# Patient Record
Sex: Male | Born: 1995 | Race: White | Hispanic: No | Marital: Single | Smoking: Never smoker
Health system: Southern US, Community
[De-identification: ages and names within clinical notes are randomized; demographics above are authoritative.]

## PROBLEM LIST (undated history)

## (undated) DIAGNOSIS — Z8679 Personal history of other diseases of the circulatory system: Secondary | ICD-10-CM

## (undated) HISTORY — PX: KNEE SURGERY: SHX244

---
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
## 2016-10-06 IMAGING — CR DG KNEE COMPLETE 4+V*L*
4 series · 4 of 4 positions shown · non-contrast
Comparison: None.

CLINICAL DATA: Medial left knee pain. Difficulty walking and
bearing weight. No recent injury. History of surgery in 6498.

EXAM:
LEFT KNEE - COMPLETE 4+ VIEW

[knee ap]
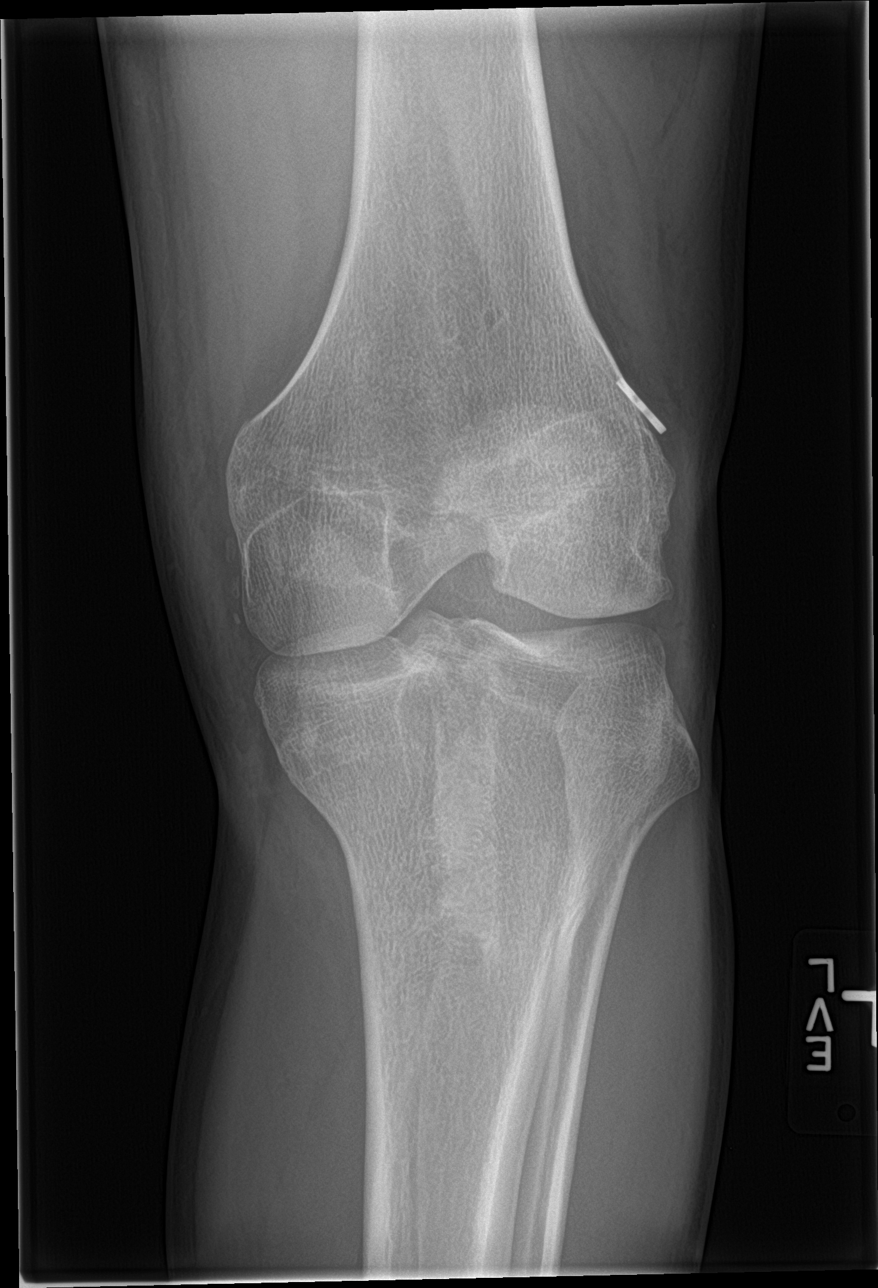

[knee lat]
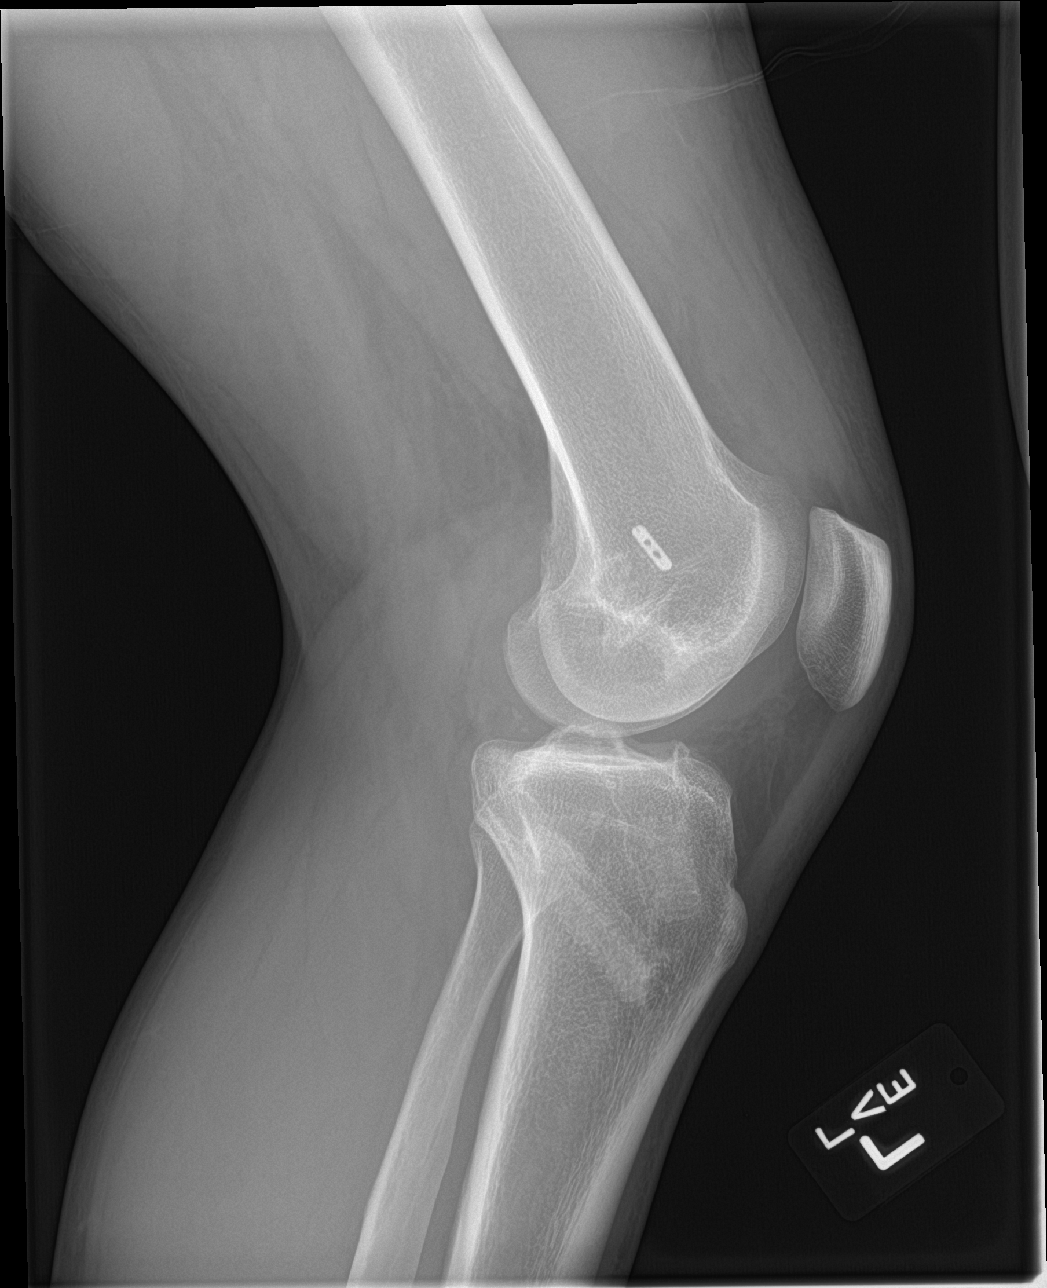

[knee obl (1 of 2)]
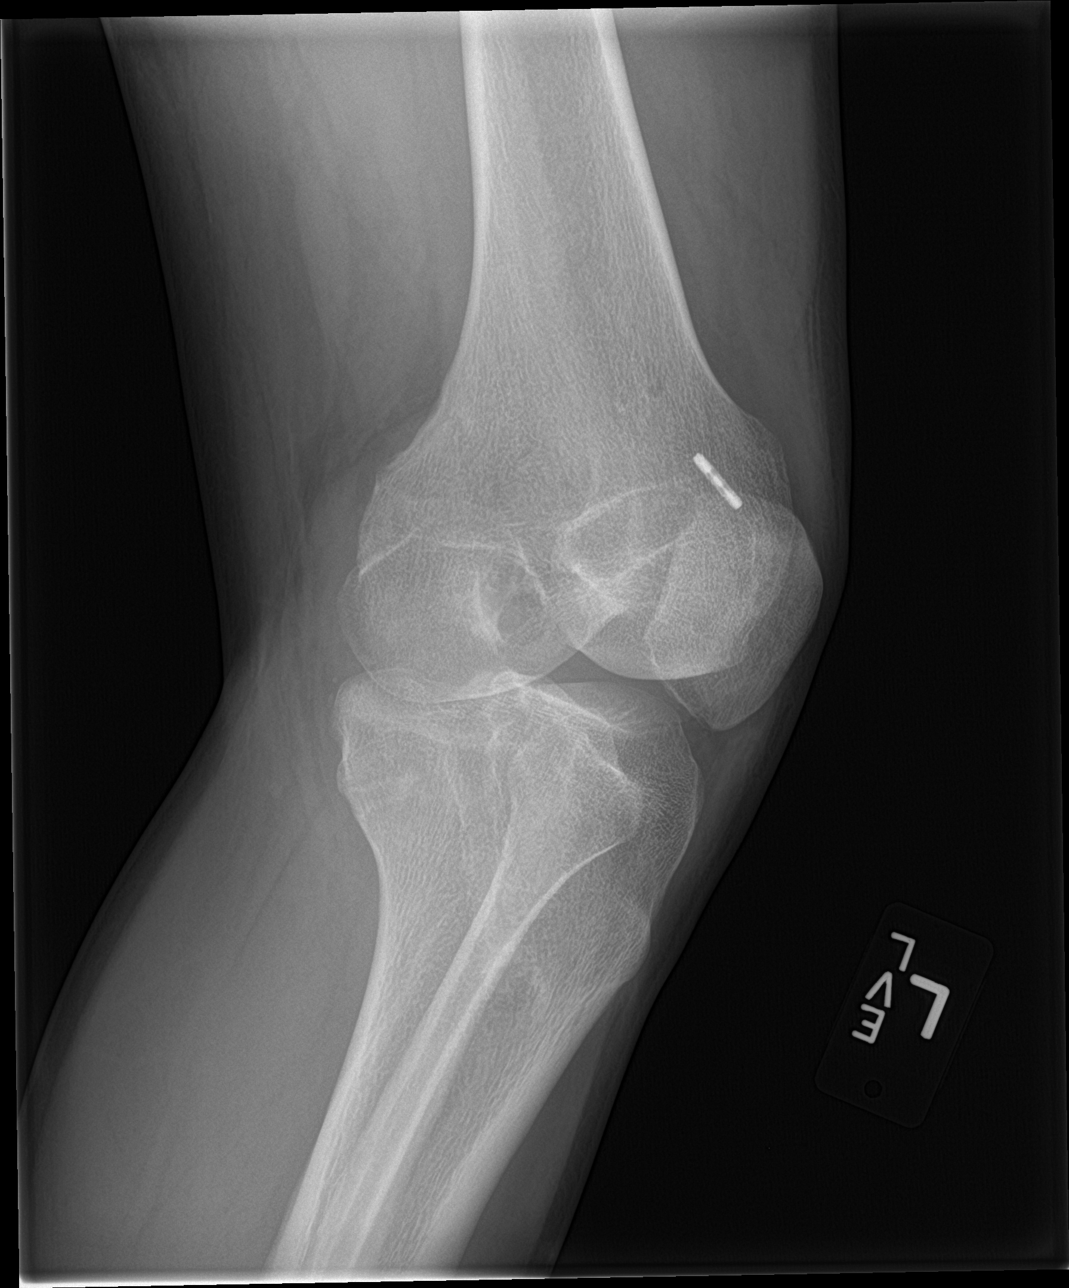

[knee obl (2 of 2)]
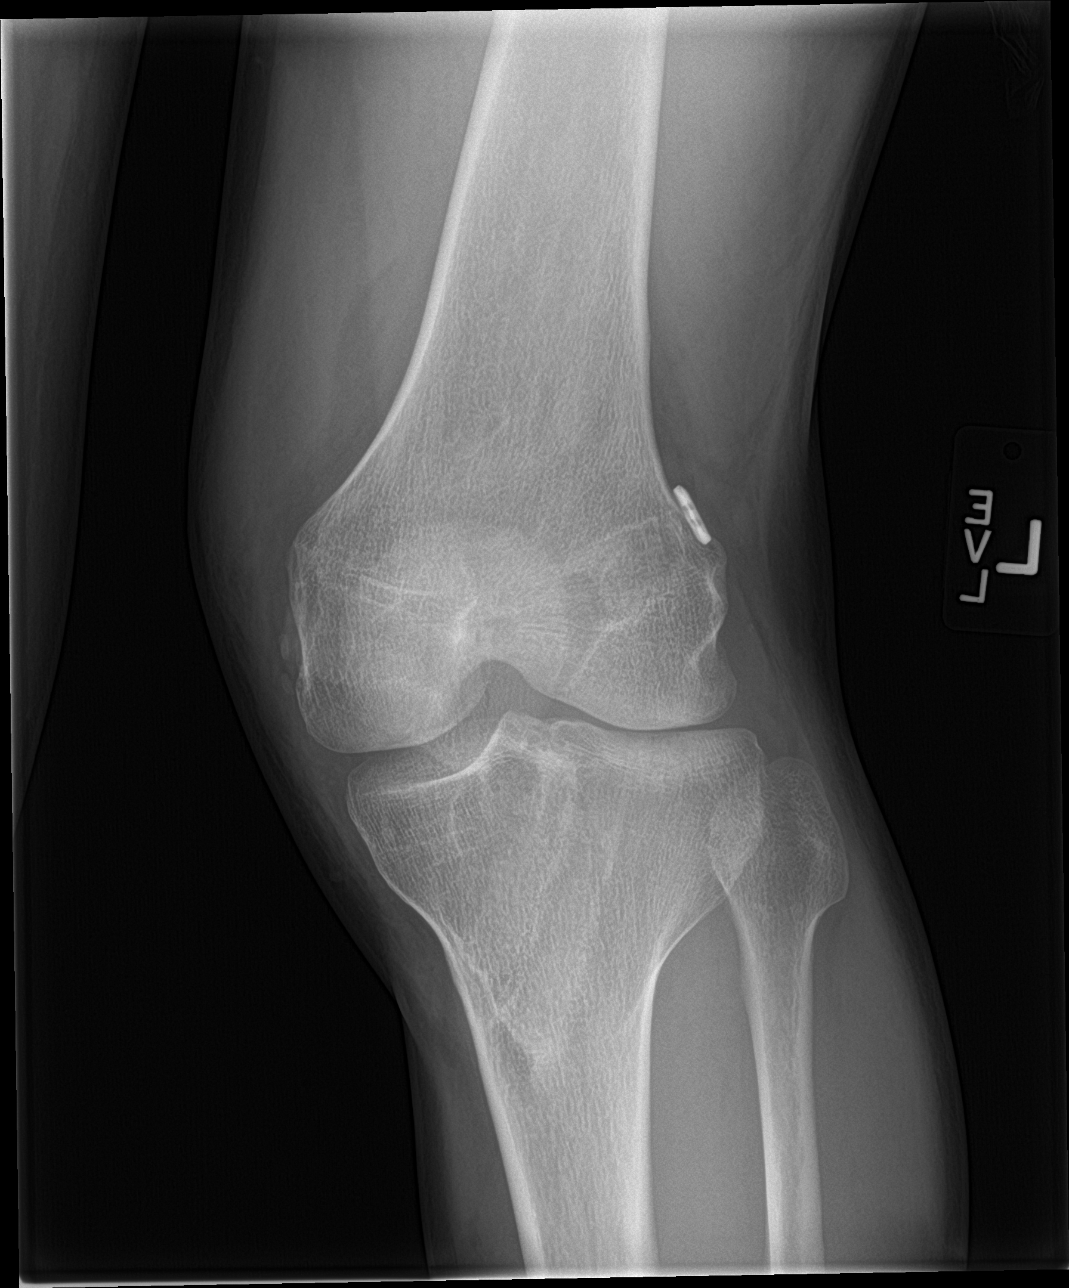

[4 of 4 positions shown; findings below may reference images not displayed]

FINDINGS: No acute bony or joint abnormality is identified. The patient is
status post ACL repair. Small calcifications along the medial
femoral condyle are most consistent with remote MCL injury. There is
a small joint effusion. Joint spaces are preserved.
IMPRESSION: No acute abnormality.

Status post ACL repair. Findings compatible with remote MCL injury
also noted.

## 2017-03-11 ENCOUNTER — Emergency Department (INDEPENDENT_AMBULATORY_CARE_PROVIDER_SITE_OTHER)
Admission: EM | Admit: 2017-03-11 | Discharge: 2017-03-11 | Disposition: A | Discharge status: Home / Self Care | Source: Home / Self Care | Attending: Family Medicine | Admitting: Family Medicine

## 2017-03-11 ENCOUNTER — Encounter: Payer: Self-pay | Admitting: *Deleted

## 2017-03-11 ENCOUNTER — Other Ambulatory Visit: Payer: Self-pay

## 2017-03-11 ENCOUNTER — Emergency Department (INDEPENDENT_AMBULATORY_CARE_PROVIDER_SITE_OTHER)

## 2017-03-11 DIAGNOSIS — R05 Cough: Secondary | ICD-10-CM

## 2017-03-11 DIAGNOSIS — R053 Chronic cough: Secondary | ICD-10-CM

## 2017-03-11 MED ORDER — CETIRIZINE HCL 10 MG PO TABS: 10.0000 mg | ORAL_TABLET | Freq: Every day | ORAL | 1 refills | Status: AC

## 2017-03-11 MED ORDER — IPRATROPIUM BROMIDE 0.06 % NA SOLN: 2.0000 | Freq: Four times a day (QID) | NASAL | 1 refills | Status: AC

## 2017-03-11 NOTE — ED Triage Notes (Signed)
Pt c/o nonproductive cough x 1 year. Denies fever. He is [employer] and recently returned home [place].

## 2017-03-11 NOTE — Discharge Instructions (Signed)
°  For the Ipratropium nasal spray, be sure to use as prescribed to help prevent post-nasal drip, which can trigger coughing, especially at night. ° °Use 2 sprays per nostril 4 times daily while sick.  You should spray one time in each nostril pointing the spray to the out portion of your nostril, breath in slowly while spraying. Wait about 30 seconds to 1 minute before given the second spray in each nostril.  This will ensure you get the most benefit from each spray.   ° °

## 2017-03-11 NOTE — ED Provider Notes (Signed)
[facility]    CSN: [id number] Arrival date & time: 03/11/17  0957     History   Chief Complaint Chief Complaint  Patient presents with  . Cough    HPI Joseph Yoder is a 21 y.o. male.   HPI Joseph Yoder is a 21 y.o. male presenting to UC with c/o nonproductive cough for about 1 year. Cough is intermittent throughout the day but worse at night and in the morning. Denies chest pain or SOB. Denies congestion, sore throat, ear pain, fever, chills, n/v/d.  Denies hx of acid reflux. Pt is in the [employer] and recently returned home from [place]. Denies leg pain or swelling. No hx of clots. He received the TB vaccine. Denies hemoptysis.  He does have some environmental allergies but has not tried any OTC allergy medication.    Past Medical History:  Diagnosis Date  . H/O cardiac murmur     Patient Active Problem List   Diagnosis Date Noted  . CONJUNCTIVITIS, VIRAL 01/27/2007    Past Surgical History:  Procedure Laterality Date  . KNEE SURGERY Left   . KNEE SURGERY  2015       Home Medications    Prior to Admission medications   Medication Sig Start Date End Date Taking? Authorizing Provider  cetirizine (ZYRTEC) 10 MG tablet Take 1 tablet (10 mg total) by mouth daily. 03/11/17   Lurene ShadowPhelps, Lexiana Spindel O, PA-C  ipratropium (ATROVENT) 0.06 % nasal spray Place 2 sprays into both nostrils 4 (four) times daily. 03/11/17   Lurene ShadowPhelps, Brigett Estell O, PA-C    Family History Family History  Problem Relation Age of Onset  . Stroke Other     Social History Social History   Tobacco Use  . Smoking status: Never Smoker  . Smokeless tobacco: Current User  Substance Use Topics  . Alcohol use: No  . Drug use: No     Allergies   Apple   Review of Systems Review of Systems  Constitutional: Negative for chills and fever.  HENT: Negative for congestion, ear pain, sore throat, trouble swallowing and voice change.   Respiratory: Positive for cough. Negative for shortness of  breath.   Cardiovascular: Negative for chest pain and palpitations.  Gastrointestinal: Negative for abdominal pain, diarrhea, nausea and vomiting.  Musculoskeletal: Negative for arthralgias, back pain and myalgias.  Skin: Negative for rash.     Physical Exam Triage Vital Signs ED Triage Vitals  Enc Vitals Group     BP 03/11/17 1026 122/74     Pulse Rate 03/11/17 1026 69     Resp 03/11/17 1026 16     Temp 03/11/17 1026 98.1 F (36.7 C)     Temp Source 03/11/17 1026 Oral     SpO2 03/11/17 1026 99 %     Weight 03/11/17 1027 162 lb (73.5 kg)     Height 03/11/17 1027 5\' 5"  (1.651 m)     Head Circumference --      Peak Flow --      Pain Score 03/11/17 1027 0     Pain Loc --      Pain Edu? --      Excl. in GC? --    No data found.  Updated Vital Signs BP 122/74 (BP Location: Left Arm)   Pulse 69   Temp 98.1 F (36.7 C) (Oral)   Resp 16   Ht 5\' 5"  (1.651 m)   Wt 162 lb (73.5 kg)   SpO2 99%   BMI  26.96 kg/m   Visual Acuity Right Eye Distance:   Left Eye Distance:   Bilateral Distance:    Right Eye Near:   Left Eye Near:    Bilateral Near:     Physical Exam  Constitutional: He is oriented to person, place, and time. He appears well-developed and well-nourished. No distress.  HENT:  Head: Normocephalic and atraumatic.  Right Ear: Tympanic membrane normal.  Left Ear: Tympanic membrane normal.  Nose: Nose normal. Right sinus exhibits no maxillary sinus tenderness and no frontal sinus tenderness. Left sinus exhibits no maxillary sinus tenderness and no frontal sinus tenderness.  Mouth/Throat: Uvula is midline, oropharynx is clear and moist and mucous membranes are normal.  Eyes: EOM are normal.  Neck: Normal range of motion. Neck supple.  Cardiovascular: Normal rate and regular rhythm.  Pulmonary/Chest: Effort normal and breath sounds normal. No stridor. No respiratory distress. He has no wheezes. He has no rales.  Musculoskeletal: Normal range of motion.    Lymphadenopathy:    He has no cervical adenopathy.  Neurological: He is alert and oriented to person, place, and time.  Skin: Skin is warm and dry. He is not diaphoretic.  Psychiatric: He has a normal mood and affect. His behavior is normal.  Nursing note and vitals reviewed.    UC Treatments / Results  Labs (all labs ordered are listed, but only abnormal results are displayed) Labs Reviewed - No data to display  EKG  EKG Interpretation None       Radiology Dg Chest 2 View  Result Date: 03/11/2017 CLINICAL DATA:  Cough common nonsmoker. EXAM: CHEST  2 VIEW COMPARISON:  None in PACs FINDINGS: The lungs are well-expanded and clear. The heart and mediastinal structures are normal. There is no pleural effusion. The bony thorax exhibits no acute abnormality. IMPRESSION: There is no active cardiopulmonary disease. Electronically Signed   By: David  Swazil[place] M.D.   On: 03/11/2017 10:29    Procedures Procedures (including critical care time)  Medications Ordered in UC Medications - No data to display   Initial Impression / Assessment and Plan / UC Course  I have reviewed the triage vital signs and the nursing notes.  Pertinent labs & imaging results that were available during my care of the patient were reviewed by me and considered in my medical decision making (see chart for details).     Normal vitals, exam, and CXR Question allergic component to chronic cough Will have pt start a trial of cetirizine an ipratropium nasal spray F/u with PCP in 1-2 weeks if not improving.   Final Clinical Impressions(s) / UC Diagnoses   Final diagnoses:  Persistent cough    ED Discharge Orders        Ordered    cetirizine (ZYRTEC) 10 MG tablet  Daily     03/11/17 1036    ipratropium (ATROVENT) 0.06 % nasal spray  4 times daily     03/11/17 1036       Controlled Substance Prescriptions [place] Controlled Substance Registry consulted? Not Applicable   Rolla Platehelps, Yadir Zentner O, PA-C 03/11/17  1101
